# Patient Record
Sex: Male | Born: 1980 | Race: Asian | Hispanic: No | Marital: Married | Smoking: Never smoker
Health system: Southern US, Community
[De-identification: ages and names within clinical notes are randomized; demographics above are authoritative.]

---

## 2010-12-17 ENCOUNTER — Ambulatory Visit: Payer: Self-pay

## 2017-06-06 ENCOUNTER — Encounter: Payer: Self-pay | Admitting: Emergency Medicine

## 2017-06-06 ENCOUNTER — Other Ambulatory Visit: Payer: Self-pay

## 2017-06-06 ENCOUNTER — Emergency Department
Admission: EM | Admit: 2017-06-06 | Discharge: 2017-06-07 | Disposition: A | Discharge status: Home / Self Care | Payer: 59 | Attending: Emergency Medicine | Admitting: Emergency Medicine

## 2017-06-06 DIAGNOSIS — R197 Diarrhea, unspecified: Secondary | ICD-10-CM | POA: Insufficient documentation

## 2017-06-06 DIAGNOSIS — E860 Dehydration: Secondary | ICD-10-CM | POA: Insufficient documentation

## 2017-06-06 DIAGNOSIS — A0811 Acute gastroenteropathy due to Norwalk agent: Secondary | ICD-10-CM | POA: Diagnosis not present

## 2017-06-06 LAB — CBC
HCT: 41.2 % (ref 40.0–52.0)
Hemoglobin: 13.7 g/dL (ref 13.0–18.0)
MCH: 29.8 pg (ref 26.0–34.0)
MCHC: 33.3 g/dL (ref 32.0–36.0)
MCV: 89.7 fL (ref 80.0–100.0)
Platelets: 407 10*3/uL (ref 150–440)
RBC: 4.6 MIL/uL (ref 4.40–5.90)
RDW: 12.6 % (ref 11.5–14.5)
WBC: 4.4 10*3/uL (ref 3.8–10.6)

## 2017-06-06 LAB — COMPREHENSIVE METABOLIC PANEL
ALT: 23 U/L (ref 17–63)
AST: 29 U/L (ref 15–41)
Albumin: 4 g/dL (ref 3.5–5.0)
Alkaline Phosphatase: 66 U/L (ref 38–126)
Anion gap: 7 (ref 5–15)
BUN: 16 mg/dL (ref 6–20)
CO2: 27 mmol/L (ref 22–32)
Calcium: 9 mg/dL (ref 8.9–10.3)
Chloride: 105 mmol/L (ref 101–111)
Creatinine, Ser: 0.9 mg/dL (ref 0.61–1.24)
GFR calc Af Amer: >60 mL/min (ref >60)
GFR calc non Af Amer: >60 mL/min (ref >60)
Glucose, Bld: 106 mg/dL — ABNORMAL HIGH (ref 65–99)
Potassium: 4.3 mmol/L (ref 3.5–5.1)
Sodium: 139 mmol/L (ref 135–145)
Total Bilirubin: 0.6 mg/dL (ref 0.3–1.2)
Total Protein: 7.6 g/dL (ref 6.5–8.1)

## 2017-06-06 LAB — GASTROINTESTINAL PANEL BY PCR, STOOL (REPLACES STOOL CULTURE)

## 2017-06-06 LAB — URINALYSIS, COMPLETE (UACMP) WITH MICROSCOPIC
Bacteria, UA: NONE SEEN
Bilirubin Urine: NEGATIVE
Glucose, UA: NEGATIVE mg/dL
Hgb urine dipstick: NEGATIVE
Ketones, ur: NEGATIVE mg/dL
Leukocytes, UA: NEGATIVE
Nitrite: NEGATIVE
Protein, ur: NEGATIVE mg/dL
RBC / HPF: NONE SEEN RBC/hpf (ref 0–5)
Specific Gravity, Urine: 1.003 — ABNORMAL LOW (ref 1.005–1.030)
Squamous Epithelial / LPF: NONE SEEN
WBC, UA: NONE SEEN WBC/hpf (ref 0–5)
pH: 6 (ref 5.0–8.0)

## 2017-06-06 LAB — C DIFFICILE QUICK SCREEN W PCR REFLEX
C Diff antigen: NEGATIVE
C Diff interpretation: NOT DETECTED
C Diff toxin: NEGATIVE

## 2017-06-06 LAB — LIPASE, BLOOD: Lipase: 33 U/L (ref 11–51)

## 2017-06-06 MED ORDER — LOPERAMIDE HCL 2 MG PO CAPS
2.0000 mg | ORAL_CAPSULE | Freq: Once | ORAL | Status: AC
  Administered 2017-06-06: 2 mg via ORAL
  Filled 2017-06-06: qty 1

## 2017-06-06 MED ORDER — SODIUM CHLORIDE 0.9 % IV BOLUS (SEPSIS)
1000.0000 mL | Freq: Once | INTRAVENOUS | Status: AC
  Administered 2017-06-06: 1000 mL via INTRAVENOUS

## 2017-06-06 MED ORDER — ONDANSETRON 4 MG PO TBDP: 4.0000 mg | ORAL_TABLET | Freq: Four times a day (QID) | ORAL | 0 refills | Status: DC | PRN

## 2017-06-06 NOTE — ED Notes (Signed)
ED Provider at bedside. 

## 2017-06-06 NOTE — ED Triage Notes (Signed)
Pt to ED via POV. Pt was diagnosed with pneumonia on Thursday and started on antibiotics. Pt has had diarrhea since Wednesday night. Diarrhea had not gotten any better. Pt has had 6-7 episodes of diarrhea in the last 24 hours. Pt is in NAD in triage, VS WNL.

## 2017-06-06 NOTE — ED Notes (Signed)
Patient reports he was dx with pneumonia and placed on Azithromycin Thursday. Patient reports multiple liquid stools since beginning the antibiotic. Patient reports 6-7 liquid stools in the last 24 hours. Patient denies pain, N/V.

## 2017-06-06 NOTE — ED Provider Notes (Signed)
[facility] Emergency Department Provider Note  ____________________________________________   First MD Initiated Contact with Patient 06/06/17 2057     (approximate)  I have reviewed the triage vital signs and the nursing notes.   HISTORY  Chief Complaint Diarrhea   HPI Gabriel Williamson is a 37 y.o. male reports no previous significant medical history, other than being diagnosed with the flu and treated with Tamiflu 2 weeks ago, then started to develop cough congestion about 3 days ago, placed on azithromycin, and also began having diarrhea the day before being diagnosed with a possible mild pneumonia 3 days ago.  Patient reports his breathing is much better, he is not short of breath, not having a productive cough fevers or chills.  He does however report that he is been having a lot of loose stool, he has had watery stools with no black or bloody stool.  He has about 6-7 a day for the last 2 days.  He is been drinking fluids well and is not having pain, no nausea no vomiting, but self reports lots of loose stool.  He is not training antidiarrheals.  He does report a history of travel to [place] about 2 months ago, but was not sick at the time and not been sick since returning until he got influenza.  Reports overall that he feels well, except he is concerned about the frequent diarrhea that he is having.  He does feel slightly dehydrated, but denies lightheadedness.  Reports he is able to drink lots of fluids and has been able to hold him down, but he does not think he is absorbing much.  Continues to eat well and has good appetite.  History reviewed. No pertinent past medical history.  There are no active problems to display for this patient.   History reviewed. No pertinent surgical history.  Prior to Admission medications   Medication Sig Start Date End Date Taking? Authorizing Provider  ondansetron (ZOFRAN ODT) 4 MG disintegrating tablet Take 1 tablet (4  mg total) by mouth every 6 (six) hours as needed for nausea or vomiting. 06/06/17   Sharyn Creamer, MD  Azithromycin, completed 2 days of therapy Tamiflu 2 weeks ago  Allergies Patient has no known allergies.  No family history on file.  Social History Social History   Tobacco Use  . Smoking status: Never Smoker  . Smokeless tobacco: Never Used  Substance Use Topics  . Alcohol use: Not Currently  . Drug use: Not Currently    Review of Systems Constitutional: No fever/chills Eyes: No visual changes. ENT: No sore throat.  Feels slightly dry. Cardiovascular: Denies chest pain. Respiratory: Denies shortness of breath.  Had a productive cough a couple days ago, since starting azithromycin this is gone away.  Not short of breath.  Feels like his breathing is getting much better. Gastrointestinal: No abdominal pain.  No nausea, no vomiting.    No constipation. Genitourinary: Negative for dysuria. Musculoskeletal: Negative for back pain. Skin: Negative for rash. Neurological: Negative for headaches, focal weakness or numbness.    ____________________________________________   PHYSICAL EXAM:  VITAL SIGNS: ED Triage Vitals [06/06/17 1556]  Enc Vitals Group     BP 114/68     Pulse Rate 79     Resp 16     Temp 98.4 F (36.9 C)     Temp Source Oral     SpO2 100 %     Weight 115 lb (52.2 kg)     Height 5\' 3"  (  1.6 m)     Head Circumference      Peak Flow      Pain Score 0     Pain Loc      Pain Edu?      Excl. in GC?     Constitutional: Alert and oriented. Well appearing and in no acute distress. Eyes: Conjunctivae are normal. Head: Atraumatic. Nose: No congestion/rhinnorhea. Mouth/Throat: Mucous membranes are moist. Neck: No stridor.   Cardiovascular: Normal rate, regular rhythm. Grossly normal heart sounds.  Good peripheral circulation. Respiratory: Normal respiratory effort.  No retractions. Lungs CTAB. Gastrointestinal: Soft and nontender. No  distention. Musculoskeletal: No lower extremity tenderness nor edema. Neurologic:  Normal speech and language. No gross focal neurologic deficits are appreciated.  Skin:  Skin is warm, dry and intact. No rash noted. Psychiatric: Mood and affect are normal. Speech and behavior are normal.  ____________________________________________   LABS (all labs ordered are listed, but only abnormal results are displayed)  Labs Reviewed  GASTROINTESTINAL PANEL BY PCR, STOOL (REPLACES STOOL CULTURE) - Abnormal; Notable for the following components:      Result Value   Norovirus GI/GII DETECTED (*)    All other components within normal limits  COMPREHENSIVE METABOLIC PANEL - Abnormal; Notable for the following components:   Glucose, Bld 106 (*)    All other components within normal limits  URINALYSIS, COMPLETE (UACMP) WITH MICROSCOPIC - Abnormal; Notable for the following components:   Color, Urine COLORLESS (*)    APPearance CLEAR (*)    Specific Gravity, Urine 1.003 (*)    All other components within normal limits  C DIFFICILE QUICK SCREEN W PCR REFLEX  LIPASE, BLOOD  CBC   ____________________________________________  EKG   ____________________________________________  RADIOLOGY  No indication to noted for chest imaging.  No indication to note for abdominal imaging, denies pain, afebrile. ____________________________________________   PROCEDURES  Procedure(s) performed: None  Procedures  Critical Care performed: No  ____________________________________________   INITIAL IMPRESSION / ASSESSMENT AND PLAN / ED COURSE  Pertinent labs & imaging results that were available during my care of the patient were reviewed by me and considered in my medical decision making (see chart for details).  Patient presents for evaluation of loose stools and diarrhea.  Proximally 3 days of loose watery stools, up to 6-7 loose watery bowel movements a day.  Hemodynamically stable, in no  distress, labs reassuring without evidence of major electrolyte D disorder or dehydration.  White count normal.  Reassuring abdominal exam.  Urinalysis negative.  Suspect likely self-limiting viral illness in the setting of recent upper respiratory infection and flu 2 weeks ago, now being treated with azithromycin, but reports his diarrhea started prior to treatment with antibiotic.  He appears well.  Does have a history of travel to [place], thus I will send stool culture, though the patient denies developing any infectious symptoms during or shortly following his travel abroad.  Overall suspect likely self-limited disease.  No evidence of septicemia.      ----------------------------------------- 11:37 PM on 06/06/2017 -----------------------------------------  Panel positive for neurovirus, appears consistent with his infectious symptoms.  Self-limited viral illness.  Discussed treatment recommendations, provide Zofran, careful return precautions discussed with patient and family who are agreeable.  ____________________________________________   FINAL CLINICAL IMPRESSION(S) / ED DIAGNOSES  Final diagnoses:  Diarrhea of presumed infectious origin  Norovirus      NEW MEDICATIONS STARTED DURING THIS VISIT:  New Prescriptions   ONDANSETRON (ZOFRAN ODT) 4 MG DISINTEGRATING TABLET  Take 1 tablet (4 mg total) by mouth every 6 (six) hours as needed for nausea or vomiting.     Note:  This document was prepared using Dragon voice recognition software and may include unintentional dictation errors.     Sharyn CreamerQuale, Robin Pafford, MD 06/06/17 2337

## 2017-06-06 NOTE — Discharge Instructions (Addendum)
Please return to the emergency room right away if you are to develop a fever, severe nausea, pain, you are unable to keep food down, begin vomiting any dark or bloody fluid, you develop any dark or bloody stools, feel dehydrated, or other new concerns or symptoms arise.

## 2017-06-07 NOTE — ED Notes (Signed)
Reviewed discharge instructions, follow-up care, and prescriptions with patient. Patient verbalized understanding of all information reviewed. Patient stable, with no distress noted at this time.    

## 2017-06-17 ENCOUNTER — Encounter: Payer: Self-pay | Admitting: Internal Medicine

## 2017-06-17 ENCOUNTER — Ambulatory Visit: Payer: 59 | Admitting: Internal Medicine

## 2017-06-17 VITALS — BP 120/68 | HR 86 | Resp 16 | Ht 62.0 in | Wt 117.8 lb

## 2017-06-17 DIAGNOSIS — J189 Pneumonia, unspecified organism: Secondary | ICD-10-CM

## 2017-06-17 DIAGNOSIS — R059 Cough, unspecified: Secondary | ICD-10-CM

## 2017-06-17 DIAGNOSIS — R05 Cough: Secondary | ICD-10-CM

## 2017-06-17 DIAGNOSIS — Z09 Encounter for follow-up examination after completed treatment for conditions other than malignant neoplasm: Secondary | ICD-10-CM

## 2017-06-17 NOTE — Progress Notes (Signed)
[facility] [address] [place], [place] [address]  Internal MEDICINE  Office Visit Note  Patient Name: Gabriel Williamson  [id number]  [id number]  Date of Service: 07/10/2017  Chief Complaint  Patient presents with  . Follow-up    Patient was seen by urgent care and was diagnoses with pnuemonia was told to follow up with pcp    HPI  Pt is here for routine follow up. He is feeling better, will need a follow up cxr. Feels to his baseline but occasional cough   Current Medication: Outpatient Encounter Medications as of 06/17/2017  Medication Sig  . [DISCONTINUED] ondansetron (ZOFRAN ODT) 4 MG disintegrating tablet Take 1 tablet (4 mg total) by mouth every 6 (six) hours as needed for nausea or vomiting. (Patient not taking: Reported on 06/17/2017)   No facility-administered encounter medications on file as of 06/17/2017.     Surgical History: No past surgical history on file.  Medical History: No past medical history on file.  Family History: Family History  Problem Relation Age of Onset  . Diabetes Father     Social History   Socioeconomic History  . Marital status: Married    Spouse name: Not on file  . Number of children: Not on file  . Years of education: Not on file  . Highest education level: Not on file  Occupational History  . Not on file  Social Needs  . Financial resource strain: Not on file  . Food insecurity:    Worry: Not on file    Inability: Not on file  . Transportation needs:    Medical: Not on file    Non-medical: Not on file  Tobacco Use  . Smoking status: Never Smoker  . Smokeless tobacco: Never Used  Substance and Sexual Activity  . Alcohol use: Not Currently  . Drug use: Not Currently  . Sexual activity: Not on file  Lifestyle  . Physical activity:    Days per week: Not on file    Minutes per session: Not on file  . Stress: Not on file  Relationships  . Social connections:    Talks on phone: Not on file    Gets together: Not  on file    Attends religious service: Not on file    Active member of club or organization: Not on file    Attends meetings of clubs or organizations: Not on file    Relationship status: Not on file  . Intimate partner violence:    Fear of current or ex partner: Not on file    Emotionally abused: Not on file    Physically abused: Not on file    Forced sexual activity: Not on file  Other Topics Concern  . Not on file  Social History Narrative  . Not on file    Review of Systems  Constitutional: Negative for chills, fatigue and unexpected weight change.  HENT: Positive for postnasal drip. Negative for congestion, rhinorrhea, sneezing and sore throat.   Eyes: Negative for redness.  Respiratory: Negative for cough, chest tightness and shortness of breath.   Cardiovascular: Negative for chest pain and palpitations.  Gastrointestinal: Negative for abdominal pain, constipation, diarrhea, nausea and vomiting.  Genitourinary: Negative for dysuria and frequency.  Musculoskeletal: Negative for arthralgias, back pain, joint swelling and neck pain.  Skin: Negative for rash.  Neurological: Negative.  Negative for tremors and numbness.  Hematological: Negative for adenopathy. Does not bruise/bleed easily.  Psychiatric/Behavioral: Negative for behavioral problems (Depression), sleep disturbance and  suicidal ideas. The patient is not nervous/anxious.    Vital Signs: BP 120/68   Pulse 86   Resp 16   Ht 5\' 2"  (1.575 m)   Wt 117 lb 12.8 oz (53.4 kg)   SpO2 98%   BMI 21.55 kg/m    Physical Exam  Constitutional: He is oriented to person, place, and time. He appears well-developed and well-nourished.  HENT:  Head: Normocephalic and atraumatic.  Mouth/Throat: Oropharynx is clear and moist.  Eyes: Pupils are equal, round, and reactive to light. EOM are normal.  Neck: Normal range of motion. Neck supple.  Cardiovascular: Normal rate, regular rhythm and normal heart sounds. Exam reveals no  gallop and no friction rub.  No murmur heard. Pulmonary/Chest: Effort normal.  Abdominal: Soft. Bowel sounds are normal.  Musculoskeletal: Normal range of motion.  Neurological: He is alert and oriented to person, place, and time.  Skin: Skin is warm and dry. He is not diaphoretic.  Psychiatric: He has a normal mood and affect.   Assessment/Plan: 1. Pneumonia due to infectious organism, unspecified laterality, unspecified part of lung - Finished abx as prescribed by urgent care  2. Follow-up exam - CBC with Differential/Platelet - Lipid Panel With LDL/HDL Ratio - TSH - T4, free - Comprehensive metabolic panel - Urinalysis - [id number] and Folate Panel - Fe+TIBC+Fer  3. Cough - DG Chest 2 View; Future  General Counseling: [name] verbalizes understanding of the findings of todays visit and agrees with plan of treatment. I have discussed any further diagnostic evaluation that may be needed or ordered today. We also reviewed his medications today. he has been encouraged to call the office with any questions or concerns that should arise related to todays visit  Orders Placed This Encounter  Procedures  . DG Chest 2 View  . CBC with Differential/Platelet  . Lipid Panel With LDL/HDL Ratio  . TSH  . T4, free  . Comprehensive metabolic panel  . Urinalysis  . B12 and Folate Panel  . Fe+TIBC+Fer   Time spent:15 Minutes   Dr Lyndon CodeFozia M Anuoluwapo Mefferd Internal medicine

## 2017-06-18 ENCOUNTER — Other Ambulatory Visit: Payer: Self-pay | Admitting: Internal Medicine

## 2017-06-18 DIAGNOSIS — R7309 Other abnormal glucose: Secondary | ICD-10-CM

## 2018-01-05 ENCOUNTER — Encounter: Payer: Self-pay | Admitting: Nurse Practitioner

## 2019-06-06 ENCOUNTER — Ambulatory Visit: Payer: 59 | Attending: Internal Medicine

## 2019-06-06 DIAGNOSIS — Z23 Encounter for immunization: Secondary | ICD-10-CM

## 2019-06-06 NOTE — Progress Notes (Signed)
   Covid-19 Vaccination Clinic  Name:  Gabriel Williamson    MRN: [id number] DOB: December 18, 1980  06/06/2019  [name] was observed post Covid-19 immunization for 15 minutes without incident. He was provided with Vaccine Information Sheet and instruction to access the V-Safe system.   [name] was instructed to call 911 with any severe reactions post vaccine: Marland Kitchen Difficulty breathing  . Swelling of face and throat  . A fast heartbeat  . A bad rash all over body  . Dizziness and weakness   Immunizations Administered    Name Date Dose VIS Date Route   Pfizer COVID-19 Vaccine 06/06/2019 12:15 PM 0.3 mL 02/25/2019 Intramuscular   Manufacturer: [employer], [employer]   Lot: [id number]   NDC: [address]-4

## 2019-06-28 ENCOUNTER — Ambulatory Visit: Payer: 59

## 2019-06-29 ENCOUNTER — Ambulatory Visit: Payer: 59 | Attending: Internal Medicine

## 2019-06-29 DIAGNOSIS — Z23 Encounter for immunization: Secondary | ICD-10-CM

## 2019-06-29 NOTE — Progress Notes (Signed)
   Covid-19 Vaccination Clinic  Name:  Gabriel Williamson    MRN: [id number] DOB: [birth year]  06/29/2019  [name] was observed post Covid-19 immunization for 15 minutes without incident. He was provided with Vaccine Information Sheet and instruction to access the V-Safe system.   [name] was instructed to call 911 with any severe reactions post vaccine: Marland Kitchen Difficulty breathing  . Swelling of face and throat  . A fast heartbeat  . A bad rash all over body  . Dizziness and weakness   Immunizations Administered    Name Date Dose VIS Date Route   Pfizer COVID-19 Vaccine 06/29/2019  1:13 PM 0.3 mL 02/25/2019 Intramuscular   Manufacturer: [employer], [employer]   Lot: [id number]   NDC: [address]-8

## 2019-09-29 ENCOUNTER — Ambulatory Visit (LOCAL_COMMUNITY_HEALTH_CENTER): Payer: 59

## 2019-09-29 ENCOUNTER — Other Ambulatory Visit: Payer: Self-pay

## 2019-09-29 VITALS — Wt 120.0 lb

## 2019-09-29 DIAGNOSIS — Z201 Contact with and (suspected) exposure to tuberculosis: Secondary | ICD-10-CM

## 2019-09-30 NOTE — Progress Notes (Signed)
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 Discussed with patient LTBI vs Active TB tx if QFT is positive. Richmond Campbell, RN

## 2019-10-03 ENCOUNTER — Telehealth: Payer: Self-pay

## 2019-10-03 LAB — QUANTIFERON-TB GOLD PLUS
QuantiFERON Mitogen Value: >10 IU/mL
QuantiFERON Nil Value: 0 IU/mL
QuantiFERON TB1 Ag Value: 0.04 IU/mL
QuantiFERON TB2 Ag Value: 0.05 IU/mL
QuantiFERON-TB Gold Plus: NEGATIVE

## 2019-10-03 NOTE — Telephone Encounter (Signed)
TC with patient.  Informed will repeat QFT in 8 weeks d/t household contact still contagious. Richmond Campbell, RN

## 2020-01-04 ENCOUNTER — Ambulatory Visit (LOCAL_COMMUNITY_HEALTH_CENTER): Payer: 59

## 2020-01-04 DIAGNOSIS — Z201 Contact with and (suspected) exposure to tuberculosis: Secondary | ICD-10-CM

## 2020-01-04 NOTE — Progress Notes (Signed)
QFT- 8 week follow up- contact to TB Richmond Campbell, RN

## 2020-01-07 LAB — QUANTIFERON-TB GOLD PLUS
QuantiFERON Mitogen Value: >10 IU/mL
QuantiFERON Nil Value: 2.18 IU/mL
QuantiFERON TB1 Ag Value: 2.07 IU/mL
QuantiFERON TB2 Ag Value: 2.08 IU/mL
QuantiFERON-TB Gold Plus: NEGATIVE

## 2020-03-03 ENCOUNTER — Emergency Department
Admission: EM | Admit: 2020-03-03 | Discharge: 2020-03-03 | Disposition: A | Discharge status: Home / Self Care | Payer: 59 | Attending: Emergency Medicine | Admitting: Emergency Medicine

## 2020-03-03 ENCOUNTER — Emergency Department: Payer: 59

## 2020-03-03 ENCOUNTER — Encounter: Payer: Self-pay | Admitting: Emergency Medicine

## 2020-03-03 ENCOUNTER — Other Ambulatory Visit: Payer: Self-pay

## 2020-03-03 DIAGNOSIS — R109 Unspecified abdominal pain: Secondary | ICD-10-CM | POA: Diagnosis present

## 2020-03-03 DIAGNOSIS — N200 Calculus of kidney: Secondary | ICD-10-CM

## 2020-03-03 LAB — BASIC METABOLIC PANEL
Anion gap: 8 (ref 5–15)
BUN: 17 mg/dL (ref 6–20)
CO2: 26 mmol/L (ref 22–32)
Calcium: 8.7 mg/dL — ABNORMAL LOW (ref 8.9–10.3)
Chloride: 102 mmol/L (ref 98–111)
Creatinine, Ser: 1.23 mg/dL (ref 0.61–1.24)
GFR, Estimated: >60 mL/min (ref >60)
Glucose, Bld: 157 mg/dL — ABNORMAL HIGH (ref 70–99)
Potassium: 4.1 mmol/L (ref 3.5–5.1)
Sodium: 136 mmol/L (ref 135–145)

## 2020-03-03 LAB — URINALYSIS, COMPLETE (UACMP) WITH MICROSCOPIC
Bacteria, UA: NONE SEEN
Bilirubin Urine: NEGATIVE
Glucose, UA: NEGATIVE mg/dL
Hgb urine dipstick: NEGATIVE
Ketones, ur: NEGATIVE mg/dL
Leukocytes,Ua: NEGATIVE
Nitrite: NEGATIVE
Protein, ur: NEGATIVE mg/dL
Specific Gravity, Urine: 1.025 (ref 1.005–1.030)
Squamous Epithelial / LPF: NONE SEEN (ref 0–5)
pH: 6 (ref 5.0–8.0)

## 2020-03-03 LAB — CBC
HCT: 41.6 % (ref 39.0–52.0)
Hemoglobin: 13.8 g/dL (ref 13.0–17.0)
MCH: 30.9 pg (ref 26.0–34.0)
MCHC: 33.2 g/dL (ref 30.0–36.0)
MCV: 93.1 fL (ref 80.0–100.0)
Platelets: 266 10*3/uL (ref 150–400)
RBC: 4.47 MIL/uL (ref 4.22–5.81)
RDW: 11.8 % (ref 11.5–15.5)
WBC: 10.2 10*3/uL (ref 4.0–10.5)
nRBC: 0 % (ref 0.0–0.2)

## 2020-03-03 MED ORDER — HYDROMORPHONE HCL 1 MG/ML IJ SOLN
0.5000 mg | Freq: Once | INTRAMUSCULAR | Status: AC
  Administered 2020-03-03: 0.5 mg via INTRAVENOUS
  Filled 2020-03-03: qty 1

## 2020-03-03 MED ORDER — SODIUM CHLORIDE 0.9 % IV BOLUS
1000.0000 mL | Freq: Once | INTRAVENOUS | Status: AC
  Administered 2020-03-03: 1000 mL via INTRAVENOUS

## 2020-03-03 MED ORDER — ONDANSETRON HCL 4 MG/2ML IJ SOLN
4.0000 mg | Freq: Once | INTRAMUSCULAR | Status: AC
  Administered 2020-03-03: 4 mg via INTRAVENOUS
  Filled 2020-03-03: qty 2

## 2020-03-03 MED ORDER — TAMSULOSIN HCL 0.4 MG PO CAPS: 0.4000 mg | ORAL_CAPSULE | Freq: Every day | ORAL | 0 refills | Status: AC

## 2020-03-03 MED ORDER — KETOROLAC TROMETHAMINE 30 MG/ML IJ SOLN
30.0000 mg | Freq: Once | INTRAMUSCULAR | Status: AC
  Administered 2020-03-03: 30 mg via INTRAVENOUS
  Filled 2020-03-03: qty 1

## 2020-03-03 MED ORDER — IBUPROFEN 600 MG PO TABS: 600.0000 mg | ORAL_TABLET | Freq: Three times a day (TID) | ORAL | 0 refills | Status: AC | PRN

## 2020-03-03 MED ORDER — MORPHINE SULFATE (PF) 4 MG/ML IV SOLN
4.0000 mg | Freq: Once | INTRAVENOUS | Status: AC
  Administered 2020-03-03: 4 mg via INTRAVENOUS
  Filled 2020-03-03: qty 1

## 2020-03-03 MED ORDER — ONDANSETRON 4 MG PO TBDP: 4.0000 mg | ORAL_TABLET | Freq: Three times a day (TID) | ORAL | 0 refills | Status: DC | PRN

## 2020-03-03 MED ORDER — HYDROCODONE-ACETAMINOPHEN 5-325 MG PO TABS: 1.0000 | ORAL_TABLET | Freq: Four times a day (QID) | ORAL | 0 refills | Status: DC | PRN

## 2020-03-03 MED ORDER — OXYCODONE-ACETAMINOPHEN 5-325 MG PO TABS
1.0000 | ORAL_TABLET | ORAL | Status: DC | PRN
  Administered 2020-03-03: 1 via ORAL
  Filled 2020-03-03: qty 1

## 2020-03-03 NOTE — ED Notes (Signed)
D/C and new RX discussed with pt, pt and wife verbalized understanding. VSS.

## 2020-03-03 NOTE — ED Triage Notes (Signed)
Unable to reassess VS at this time due to pt being in CT

## 2020-03-03 NOTE — ED Notes (Signed)
Pt tolerated PO challenge well, no nausea noted.

## 2020-03-03 NOTE — ED Notes (Signed)
Pt presents to ED with c/o of R flank pain that started last night. Pt deneis HX of kidney stones. Pt states N/V due to pain. Pt states 4 episodes of vomitus. NAD noted at this time. Pt denies fevers or chills. Pt is A&Ox4. Pt denies difficulty with urinating and states urinating as usual.Wife at bedside.

## 2020-03-03 NOTE — ED Provider Notes (Signed)
[facility] Emergency Department Provider Note  ____________________________________________   Event Date/Time   First MD Initiated Contact with Patient 03/03/20 [phone]     (approximate)  I have reviewed the triage vital signs and the nursing notes.   HISTORY  Chief Complaint Flank Pain    HPI Horris Speros is a 39 y.o. male  Here with R flank pain. Pt reports that around 11 PM last night, he awoke with severe, aching, gnawing R flank pain. Since then, the pain has persisted btu intermittently worsened. He's had associated nausea, vomiting. No diarrhea, constipation. No fever, chills. He has not noticed any dysuria or frequency. No personal history of kidney stones but does have a family history. Pain mildly improved w/ some position changes, but then returns. No other alleviating factors. No specific aggravating factors.        History reviewed. No pertinent past medical history.  There are no problems to display for this patient.   History reviewed. No pertinent surgical history.  Prior to Admission medications   Medication Sig Start Date End Date Taking? Authorizing Provider  HYDROcodone-acetaminophen (NORCO/VICODIN) 5-325 MG tablet Take 1-2 tablets by mouth every 6 (six) hours as needed for moderate pain or severe pain (No more than 6 tabs per day). 03/03/20 03/03/21  Shaune Pollack, MD  ibuprofen (ADVIL) 600 MG tablet Take 1 tablet (600 mg total) by mouth every 8 (eight) hours as needed for moderate pain. 03/03/20   Shaune Pollack, MD  ondansetron (ZOFRAN ODT) 4 MG disintegrating tablet Take 1 tablet (4 mg total) by mouth every 8 (eight) hours as needed for nausea or vomiting. 03/03/20   Shaune Pollack, MD  tamsulosin (FLOMAX) 0.4 MG CAPS capsule Take 1 capsule (0.4 mg total) by mouth daily for 3 days. 03/03/20 03/06/20  Shaune Pollack, MD    Allergies Patient has no known allergies.  Family History  Problem Relation Age of Onset  . Diabetes  Father     Social History Social History   Tobacco Use  . Smoking status: Never Smoker  . Smokeless tobacco: Never Used  Vaping Use  . Vaping Use: Never used  Substance Use Topics  . Alcohol use: Not Currently  . Drug use: Not Currently    Review of Systems  Review of Systems  Constitutional: Negative for chills and fever.  HENT: Negative for sore throat.   Respiratory: Negative for shortness of breath.   Cardiovascular: Negative for chest pain.  Gastrointestinal: Positive for nausea and vomiting. Negative for abdominal pain.  Genitourinary: Positive for flank pain.  Musculoskeletal: Negative for neck pain.  Skin: Negative for rash and wound.  Allergic/Immunologic: Negative for immunocompromised state.  Neurological: Negative for weakness and numbness.  Hematological: Does not bruise/bleed easily.  All other systems reviewed and are negative.    ____________________________________________  PHYSICAL EXAM:      VITAL SIGNS: ED Triage Vitals  Enc Vitals Group     BP 03/03/20 0224 114/81     Pulse Rate 03/03/20 0224 95     Resp 03/03/20 0224 20     Temp 03/03/20 0224 98.6 F (37 C)     Temp Source 03/03/20 0224 Oral     SpO2 03/03/20 0224 100 %     Weight 03/03/20 0227 120 lb (54.4 kg)     Height 03/03/20 0227 5\' 3"  (1.6 m)     Head Circumference --      Peak Flow --      Pain Score 03/03/20 0226  10     Pain Loc --      Pain Edu? --      Excl. in GC? --      Physical Exam Vitals and nursing note reviewed.  Constitutional:      General: He is not in acute distress.    Appearance: He is well-developed.  HENT:     Head: Normocephalic and atraumatic.  Eyes:     Conjunctiva/sclera: Conjunctivae normal.  Cardiovascular:     Rate and Rhythm: Normal rate and regular rhythm.     Heart sounds: Normal heart sounds. No murmur heard. No friction rub.  Pulmonary:     Effort: Pulmonary effort is normal. No respiratory distress.     Breath sounds: Normal breath  sounds. No wheezing or rales.  Abdominal:     General: There is no distension.     Palpations: Abdomen is soft.     Tenderness: There is abdominal tenderness in the right upper quadrant, right lower quadrant and suprapubic area. There is no guarding or rebound.  Musculoskeletal:     Cervical back: Neck supple.  Skin:    General: Skin is warm.     Capillary Refill: Capillary refill takes less than 2 seconds.  Neurological:     Mental Status: He is alert and oriented to person, place, and time.     Motor: No abnormal muscle tone.       ____________________________________________   LABS (all labs ordered are listed, but only abnormal results are displayed)  Labs Reviewed  URINALYSIS, COMPLETE (UACMP) WITH MICROSCOPIC - Abnormal; Notable for the following components:      Result Value   Color, Urine YELLOW (*)    APPearance HAZY (*)    All other components within normal limits  BASIC METABOLIC PANEL - Abnormal; Notable for the following components:   Glucose, Bld 157 (*)    Calcium 8.7 (*)    All other components within normal limits  CBC    ____________________________________________  EKG:  ________________________________________  RADIOLOGY All imaging, including plain films, CT scans, and ultrasounds, independently reviewed by me, and interpretations confirmed via formal radiology reads.  ED MD interpretation:   CT Stone: 2 mm stone distal right ureter with mild hydro  Official radiology report(s): CT Renal Stone Study  Result Date: 03/03/2020 CLINICAL DATA:  Right flank pain. EXAM: CT ABDOMEN AND PELVIS WITHOUT CONTRAST TECHNIQUE: Multidetector CT imaging of the abdomen and pelvis was performed following the standard protocol without IV contrast. COMPARISON:  None. FINDINGS: Lower chest: No acute abnormality. Hepatobiliary: No focal liver abnormality is seen. No gallstones, gallbladder wall thickening, or biliary dilatation. Pancreas: Unremarkable. No pancreatic  ductal dilatation or surrounding inflammatory changes. Spleen: Normal in size without focal abnormality. Adrenals/Urinary Tract: Adrenal glands are normal. No renal stones identified. There is mild right hydronephrosis and perinephric stranding. There is right ureterectasis. These finding are due to a 2 mm stone in the distal right ureter. The left kidney is smaller than the right with no hydronephrosis or perinephric stranding. The left ureter and bladder are normal. Stomach/Bowel: The stomach and small bowel are normal. The appendix is normal in appearance. The colon is unremarkable. Vascular/Lymphatic: No significant vascular findings are present. No enlarged abdominal or pelvic lymph nodes. Reproductive: Prostate is unremarkable. Other: No free air or free fluid. There is a fat containing periumbilical hernia which is small. There is a rounded partially calcified structure along the lateral aspect of the cecum on axial image 51 of doubtful significance.  Musculoskeletal: No acute or significant osseous findings. IMPRESSION: 1. There is a 2 mm stone in the distal right ureter resulting in mild hydronephrosis, perinephric stranding, and ureterectasis. 2. The left kidney measures 8.4 cm in craniocaudal dimension, mildly atrophic. 3. No other acute abnormalities. Electronically Signed   By: Gerome Sam III M.D   On: 03/03/2020 08:58    ____________________________________________  PROCEDURES   Procedure(s) performed (including Critical Care):  Procedures  ____________________________________________  INITIAL IMPRESSION / MDM / ASSESSMENT AND PLAN / ED COURSE  As part of my medical decision making, I reviewed the following data within the electronic MEDICAL RECORD NUMBER Nursing notes reviewed and incorporated, Old chart reviewed, Notes from prior ED visits, and [place] Controlled Substance Database       *Aarib Pulido was evaluated in Emergency Department on 03/03/2020 for the symptoms described in the  history of present illness. He was evaluated in the context of the global COVID-19 pandemic, which necessitated consideration that the patient might be at risk for infection with the SARS-CoV-2 virus that causes COVID-19. Institutional protocols and algorithms that pertain to the evaluation of patients at risk for COVID-19 are in a state of rapid change based on information released by regulatory bodies including the CDC and federal and state organizations. These policies and algorithms were followed during the patient's care in the ED.  Some ED evaluations and interventions may be delayed as a result of limited staffing during the pandemic.*     Medical Decision Making:  39 yo M here with R flank pain. CT reviewed, shows 2 mm stone distal R ureter which corresponds with his symptoms. UA has no pyuria, bacteriuria or signs of infection. WBC normal. No testicular/scrotal pain. Pt given fluids, antiemetics and analgesics in ED with good effect. Will d/c with encouraged hydration, supportive care, outpt follow-up.  ____________________________________________  FINAL CLINICAL IMPRESSION(S) / ED DIAGNOSES  Final diagnoses:  Kidney stone  Flank pain     MEDICATIONS GIVEN DURING THIS VISIT:  Medications  sodium chloride 0.9 % bolus 1,000 mL (1,000 mLs Intravenous Bolus 03/03/20 0901)  morphine 4 MG/ML injection 4 mg (4 mg Intravenous Given 03/03/20 0902)  ondansetron (ZOFRAN) injection 4 mg (4 mg Intravenous Given 03/03/20 0902)  ketorolac (TORADOL) 30 MG/ML injection 30 mg (30 mg Intravenous Given 03/03/20 0903)  HYDROmorphone (DILAUDID) injection 0.5 mg (0.5 mg Intravenous Given 03/03/20 1056)     ED Discharge Orders         Ordered    ibuprofen (ADVIL) 600 MG tablet  Every 8 hours PRN        03/03/20 0908    HYDROcodone-acetaminophen (NORCO/VICODIN) 5-325 MG tablet  Every 6 hours PRN        03/03/20 0908    ondansetron (ZOFRAN ODT) 4 MG disintegrating tablet  Every 8 hours PRN         03/03/20 0908    tamsulosin (FLOMAX) 0.4 MG CAPS capsule  Daily        03/03/20 0908           Note:  This document was prepared using Dragon voice recognition software and may include unintentional dictation errors.   Shaune Pollack, MD 03/03/20 1125

## 2020-03-03 NOTE — ED Triage Notes (Signed)
Pt reports right flank pain since 10 pm last night reports has vomited about 4 times, denies any urinary symptoms. Pt talks in complete sentences no respiratory distress noted

## 2020-03-03 NOTE — Discharge Instructions (Addendum)
Your symptoms today were due to a kidney stone.  Drink plenty of fluids.  I prescribed pain medication, nausea medication, as well as Flomax, which will hopefully help you pass the stone.

## 2020-03-12 ENCOUNTER — Other Ambulatory Visit: Payer: Self-pay

## 2020-03-12 ENCOUNTER — Encounter: Payer: Self-pay | Admitting: Urology

## 2020-03-12 ENCOUNTER — Ambulatory Visit
Admission: RE | Admit: 2020-03-12 | Discharge: 2020-03-12 | Disposition: A | Discharge status: Home / Self Care | Payer: 59 | Attending: Urology | Admitting: Urology

## 2020-03-12 ENCOUNTER — Ambulatory Visit: Payer: 59 | Admitting: Urology

## 2020-03-12 ENCOUNTER — Ambulatory Visit
Admission: RE | Admit: 2020-03-12 | Discharge: 2020-03-12 | Disposition: A | Discharge status: Home / Self Care | Payer: 59 | Source: Ambulatory Visit | Attending: Urology | Admitting: Urology

## 2020-03-12 VITALS — BP 114/75 | HR 92 | Ht 63.0 in | Wt 117.0 lb

## 2020-03-12 DIAGNOSIS — N200 Calculus of kidney: Secondary | ICD-10-CM

## 2020-03-12 DIAGNOSIS — N201 Calculus of ureter: Secondary | ICD-10-CM

## 2020-03-12 NOTE — Progress Notes (Signed)
03/12/2020 9:23 AM   Gabriel Williamson November 02, 1980 [id number]  Referring provider: [facility] [address] [place],  [place] [address]  Chief Complaint  Patient presents with  . Nephrolithiasis    HPI: Gabriel Williamson is a 39 y.o. male seen in follow-up for recent ED visit for renal colic.   Presented to [facility] ED 03/03/2020 with several hour history of right flank pain; some radiation to the right lower quadrant  + Nausea/vomiting  No fever or chills  Stone protocol CT remarkable for a 2 mm right distal ureteral calculus with mild hydronephrosis/hydroureter  Discharged on hydrocodone, ibuprofen, Zofran and tamsulosin  States he continues to have intermittent right flank pain and is taking ibuprofen  Remains on tamsulosin   PMH: History reviewed. No pertinent past medical history.  Surgical History: History reviewed. No pertinent surgical history.  Home Medications:  Allergies as of 03/12/2020   No Known Allergies     Medication List       Accurate as of March 12, 2020  9:23 AM. If you have any questions, ask your nurse or doctor.        HYDROcodone-acetaminophen 5-325 MG tablet Commonly known as: NORCO/VICODIN Take 1-2 tablets by mouth every 6 (six) hours as needed for moderate pain or severe pain (No more than 6 tabs per day).   ibuprofen 600 MG tablet Commonly known as: ADVIL Take 1 tablet (600 mg total) by mouth every 8 (eight) hours as needed for moderate pain.   ondansetron 4 MG disintegrating tablet Commonly known as: Zofran ODT Take 1 tablet (4 mg total) by mouth every 8 (eight) hours as needed for nausea or vomiting.       Allergies: No Known Allergies  Family History: Family History  Problem Relation Age of Onset  . Diabetes Father     Social History:  reports that he has never smoked. He has never used smokeless tobacco. He reports previous alcohol use. He reports previous drug use.   Physical Exam: BP 114/75   Pulse  92   Ht 5\' 3"  (1.6 m)   Wt 117 lb (53.1 kg)   BMI 20.73 kg/m   Constitutional:  Alert and oriented, No acute distress. HEENT: [place] AT, moist mucus membranes.  Trachea midline, no masses. Cardiovascular: No clubbing, cyanosis, or edema. Respiratory: Normal respiratory effort, no increased work of breathing. GI: Abdomen is soft, nontender, nondistended, no abdominal masses GU: No CVA tenderness Lymph: No cervical or inguinal lymphadenopathy. Skin: No rashes, bruises or suspicious lesions. Neurologic: Grossly intact, no focal deficits, moving all 4 extremities. Psychiatric: Normal mood and affect.  Laboratory Data:  Urinalysis Dipstick/microscopy negative  Pertinent Imaging: Images were personally reviewed and interpreted  CT Renal Stone Study  Narrative CLINICAL DATA:  Right flank pain.  EXAM: CT ABDOMEN AND PELVIS WITHOUT CONTRAST  TECHNIQUE: Multidetector CT imaging of the abdomen and pelvis was performed following the standard protocol without IV contrast.  COMPARISON:  None.  FINDINGS: Lower chest: No acute abnormality.  Hepatobiliary: No focal liver abnormality is seen. No gallstones, gallbladder wall thickening, or biliary dilatation.  Pancreas: Unremarkable. No pancreatic ductal dilatation or surrounding inflammatory changes.  Spleen: Normal in size without focal abnormality.  Adrenals/Urinary Tract: Adrenal glands are normal. No renal stones identified. There is mild right hydronephrosis and perinephric stranding. There is right ureterectasis. These finding are due to a 2 mm stone in the distal right ureter. The left kidney is smaller than the right with no hydronephrosis or perinephric stranding.  The left ureter and bladder are normal.  Stomach/Bowel: The stomach and small bowel are normal. The appendix is normal in appearance. The colon is unremarkable.  Vascular/Lymphatic: No significant vascular findings are present. No enlarged abdominal or pelvic  lymph nodes.  Reproductive: Prostate is unremarkable.  Other: No free air or free fluid. There is a fat containing periumbilical hernia which is small. There is a rounded partially calcified structure along the lateral aspect of the cecum on axial image 51 of doubtful significance.  Musculoskeletal: No acute or significant osseous findings.  IMPRESSION: 1. There is a 2 mm stone in the distal right ureter resulting in mild hydronephrosis, perinephric stranding, and ureterectasis. 2. The left kidney measures 8.4 cm in craniocaudal dimension, mildly atrophic. 3. No other acute abnormalities.   Electronically Signed By: Gerome Sam III M.D On: 03/03/2020 08:58   Assessment & Plan:    1.  Right distal ureteral calculus We discussed various treatment options for urolithiasis including observation with or without medical expulsive therapy, shockwave lithotripsy (SWL), ureteroscopy and laser lithotripsy with stent placement  We discussed that management is based on stone size, location, density, patient co-morbidities, and patient preference.   Stones <72mm in size have a >80% spontaneous passage rate. Data surrounding the use of tamsulosin for medical expulsive therapy is controversial, but meta analyses suggests it is most efficacious for distal stones between 5-44mm in size. Possible side effects include dizziness/lightheadedness, and retrograde ejaculation.  SWL has a lower stone free rate in a single procedure, but also a lower complication rate compared to ureteroscopy and avoids a stent and associated stent related symptoms. Possible complications include renal hematoma, steinstrasse, and need for additional treatment.  Ureteroscopy with laser lithotripsy and stent placement has a higher stone free rate than SWL in a single procedure, however increased complication rate including possible infection, ureteral injury, bleeding, and stent related morbidity. Common stent related  symptoms include dysuria, urgency/frequency, and flank pain.  Based on stone size and location have recommended continued medical expulsive therapy.  He will call for worsening symptoms.  KUB ordered today to see if the calculus is visualized on KUB.   Follow-up 2 weeks with repeat KUB and earlier for worsening symptoms or desire for ureteroscopy/ESWL    Riki Altes, MD  [facility] [address] [place], [place] [address] [phone]

## 2020-03-13 ENCOUNTER — Telehealth: Payer: Self-pay | Admitting: *Deleted

## 2020-03-13 ENCOUNTER — Encounter: Payer: Self-pay | Admitting: Urology

## 2020-03-13 LAB — MICROSCOPIC EXAMINATION
Bacteria, UA: NONE SEEN
Epithelial Cells (non renal): NONE SEEN /hpf (ref 0–10)

## 2020-03-13 LAB — URINALYSIS, COMPLETE
Bilirubin, UA: NEGATIVE
Glucose, UA: NEGATIVE
Ketones, UA: NEGATIVE
Leukocytes,UA: NEGATIVE
Nitrite, UA: NEGATIVE
Protein,UA: NEGATIVE
RBC, UA: NEGATIVE
Specific Gravity, UA: 1.02 (ref 1.005–1.030)
Urobilinogen, Ur: 0.2 mg/dL (ref 0.2–1.0)
pH, UA: 7 (ref 5.0–7.5)

## 2020-03-13 NOTE — Telephone Encounter (Signed)
Patient advised, voiced understanding.

## 2020-03-13 NOTE — Telephone Encounter (Signed)
-----   Message from Riki Altes, MD sent at 03/12/2020  5:24 PM EST ----- There is a calcification in the right pelvis seen on KUB which most likely represents his stone.  Recommend a follow-up office visit with KUB in 2 weeks.  If he has worsening pain and desires earlier treatment please have him contact the office for an earlier appointment

## 2020-03-26 ENCOUNTER — Ambulatory Visit: Payer: Self-pay | Admitting: Urology

## 2020-03-26 ENCOUNTER — Other Ambulatory Visit: Payer: Self-pay | Admitting: Family Medicine

## 2020-03-26 DIAGNOSIS — N201 Calculus of ureter: Secondary | ICD-10-CM

## 2020-03-28 ENCOUNTER — Other Ambulatory Visit: Payer: Self-pay

## 2020-03-28 ENCOUNTER — Ambulatory Visit
Admission: RE | Admit: 2020-03-28 | Discharge: 2020-03-28 | Disposition: A | Discharge status: Home / Self Care | Payer: 59 | Source: Ambulatory Visit | Attending: Urology | Admitting: Urology

## 2020-03-28 ENCOUNTER — Encounter: Payer: Self-pay | Admitting: Urology

## 2020-03-28 ENCOUNTER — Ambulatory Visit
Admission: RE | Admit: 2020-03-28 | Discharge: 2020-03-28 | Disposition: A | Discharge status: Home / Self Care | Payer: 59 | Attending: Urology | Admitting: Urology

## 2020-03-28 ENCOUNTER — Ambulatory Visit (INDEPENDENT_AMBULATORY_CARE_PROVIDER_SITE_OTHER): Payer: 59 | Admitting: Urology

## 2020-03-28 VITALS — BP 106/73 | HR 91 | Ht 63.0 in | Wt 115.0 lb

## 2020-03-28 DIAGNOSIS — N201 Calculus of ureter: Secondary | ICD-10-CM

## 2020-03-28 NOTE — Progress Notes (Signed)
   03/28/2020 2:54 PM   Gabriel Williamson [birth year] [id number]  Referring provider: [facility] [address] [place],  [place] [address]  Chief Complaint  Patient presents with  . Nephrolithiasis    HPI: 40 y.o. male presents for follow-up of a right ureteral calculus.   Initially seen 03/12/2020 after a mid December ED visit for right renal colic secondary to a 2 mm right distal ureteral calculus  He was still having pain at the time of that visit; KUB showed a 2 mm calcification in the right true bony pelvis of uncertain significance  Since his visit his pain has resolved however he is not aware of passing a stone  Repeat KUB shows persistent calcification in the true bony pelvis   PMH: History reviewed. No pertinent past medical history.  Surgical History: History reviewed. No pertinent surgical history.  Home Medications:  Allergies as of 03/28/2020   No Known Allergies     Medication List       Accurate as of March 28, 2020  2:54 PM. If you have any questions, ask your nurse or doctor.        HYDROcodone-acetaminophen 5-325 MG tablet Commonly known as: NORCO/VICODIN Take 1-2 tablets by mouth every 6 (six) hours as needed for moderate pain or severe pain (No more than 6 tabs per day).   ibuprofen 600 MG tablet Commonly known as: ADVIL Take 1 tablet (600 mg total) by mouth every 8 (eight) hours as needed for moderate pain.   ondansetron 4 MG disintegrating tablet Commonly known as: Zofran ODT Take 1 tablet (4 mg total) by mouth every 8 (eight) hours as needed for nausea or vomiting.       Allergies: No Known Allergies  Family History: Family History  Problem Relation Age of Onset  . Diabetes Father     Social History:  reports that he has never smoked. He has never used smokeless tobacco. He reports previous alcohol use. He reports previous drug use.   Physical Exam: There were no vitals taken for this visit.  Constitutional:   Alert and oriented, No acute distress. HEENT: [place] AT, moist mucus membranes.  Trachea midline, no masses. Cardiovascular: No clubbing, cyanosis, or edema. Respiratory: Normal respiratory effort, no increased work of breathing. Neurologic: Grossly intact, no focal deficits, moving all 4 extremities. Psychiatric: Normal mood and affect.   Pertinent Imaging: KUB today reviewed and personally interpreted  Assessment & Plan:    1.  Right distal ureteral calculus  Based on previous stone size and location it has most likely passed  CT only showed mild hydronephrosis and to ensure the calculus is not present would recommend follow-up noncontrast CT  Will call with results   Riki Altes, MD  [facility] [address] [place], [place] [address] [phone]

## 2020-04-24 ENCOUNTER — Other Ambulatory Visit: Payer: Self-pay

## 2020-04-24 ENCOUNTER — Ambulatory Visit (LOCAL_COMMUNITY_HEALTH_CENTER): Payer: 59

## 2020-04-24 ENCOUNTER — Ambulatory Visit: Payer: Self-pay

## 2020-04-24 DIAGNOSIS — Z23 Encounter for immunization: Secondary | ICD-10-CM | POA: Diagnosis not present

## 2020-04-24 NOTE — Progress Notes (Signed)
Tdap given; tolerated well.Shaelee Forni, RN  

## 2021-08-30 IMAGING — CT CT RENAL STONE PROTOCOL
2 of 4 series · 16 of 46 positions shown, 18 images · non-contrast
Comparison: None.

CLINICAL DATA: Right flank pain.

EXAM:
CT ABDOMEN AND PELVIS WITHOUT CONTRAST
TECHNIQUE: Multidetector CT imaging of the abdomen and pelvis was performed
following the standard protocol without IV contrast.

[Series 2: stone full standard · axial · 0.64mm/px · z∈[-455,-65]mm · 13 of 86 slices shown, 15 images]
[im 4/86  soft-tissue]
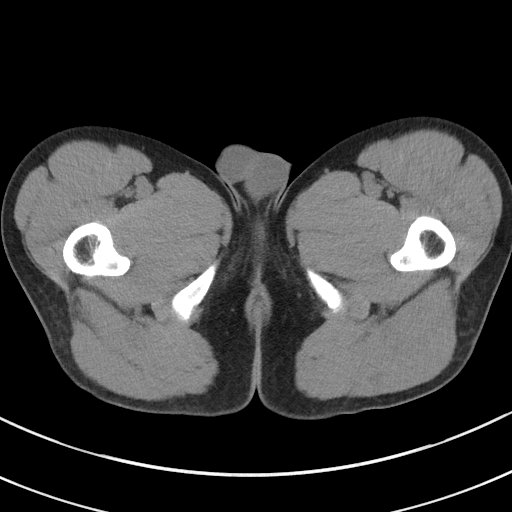
[im 4/86  bone]
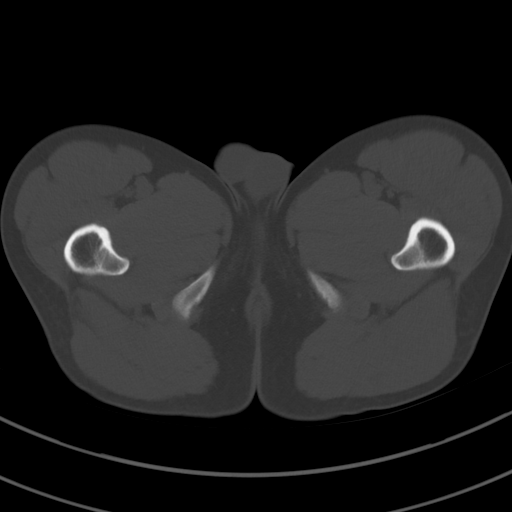
[im 11/86  soft-tissue]
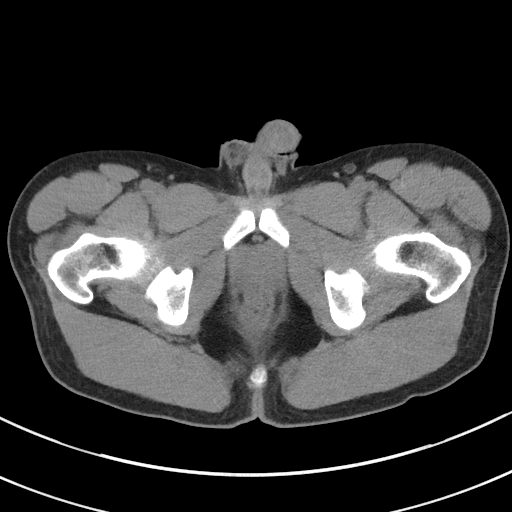
[im 18/86  soft-tissue]
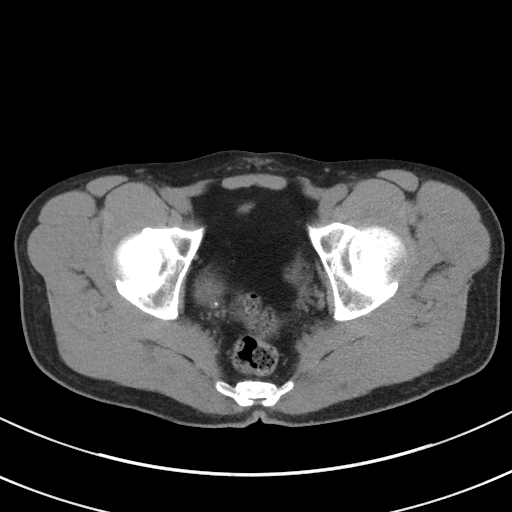
[im 24/86  soft-tissue]
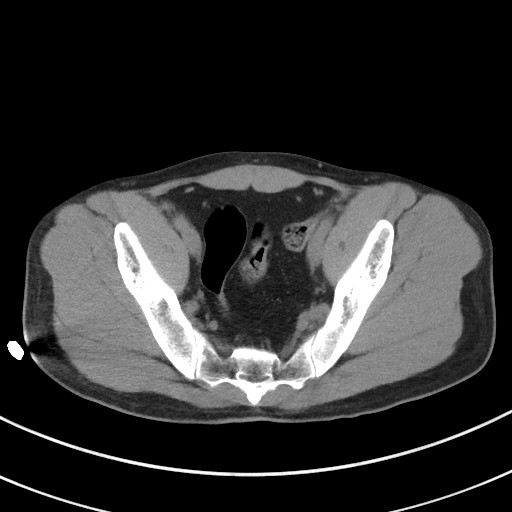
[im 31/86  soft-tissue]
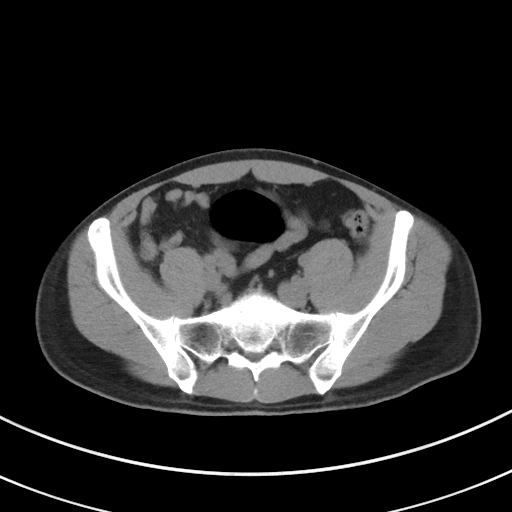
[im 38/86  soft-tissue]
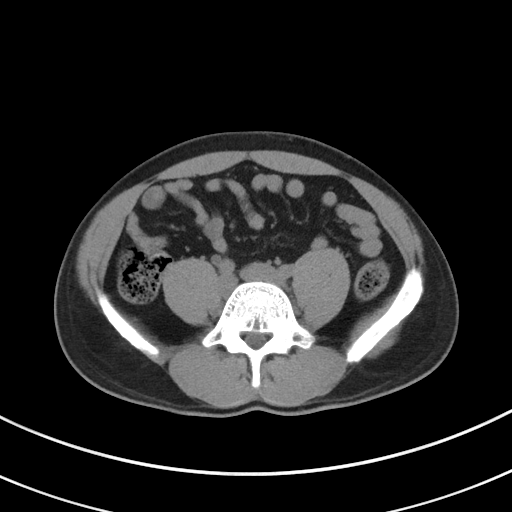
[im 45/86  soft-tissue]
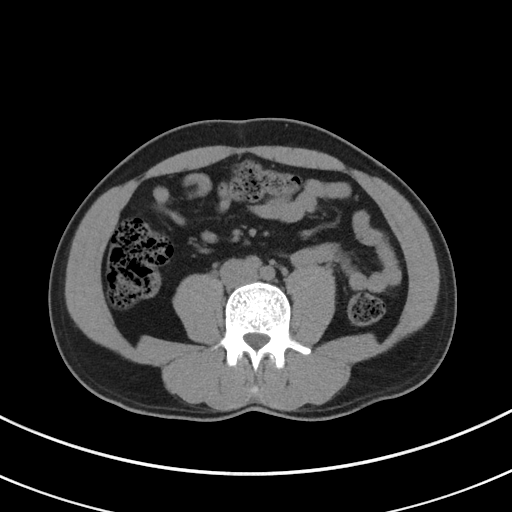
[im 48/86  soft-tissue]
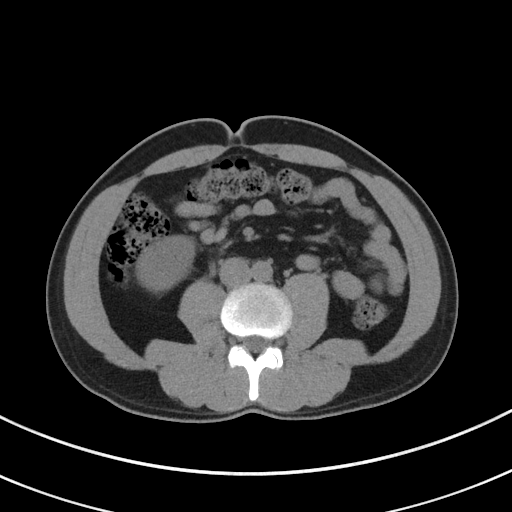
[im 55/86  soft-tissue]
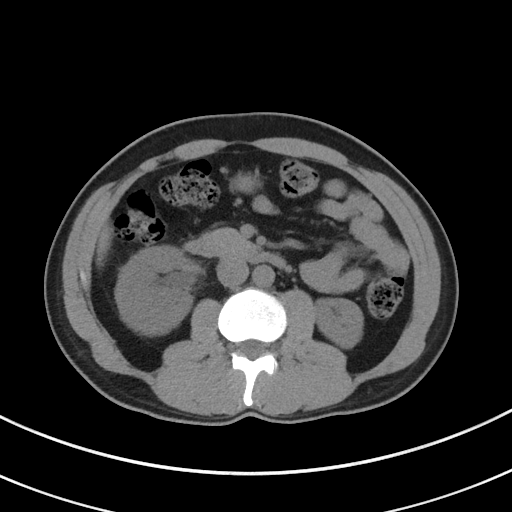
[im 55/86  bone]
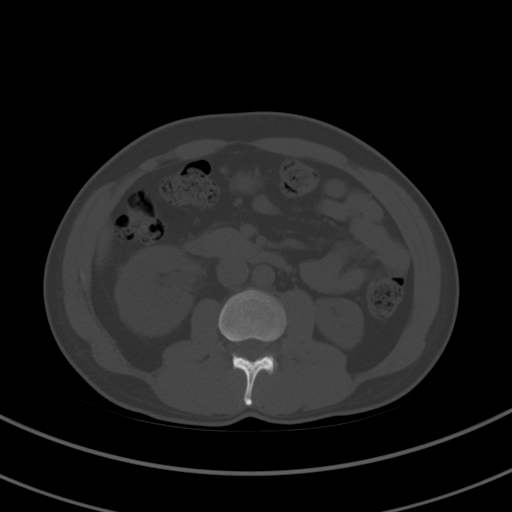
[im 62/86  soft-tissue]
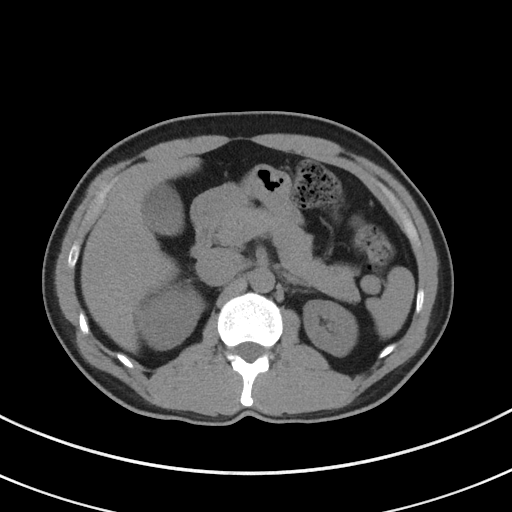
[im 69/86  soft-tissue]
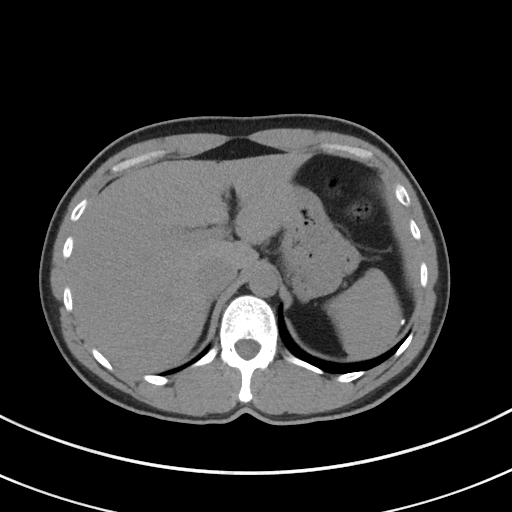
[im 75/86  soft-tissue]
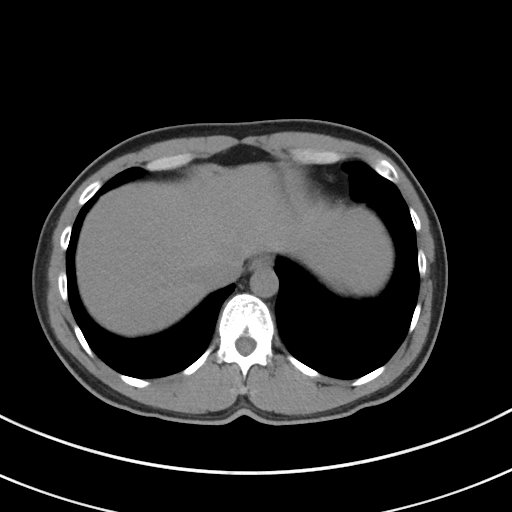
[im 82/86  soft-tissue]
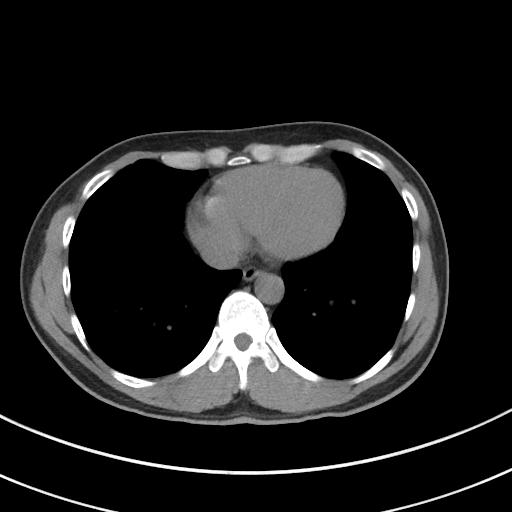

[Series 5: coronal · coronal · 0.60mm/px · 3 of 107 slices shown]
[im 36/107  soft-tissue]
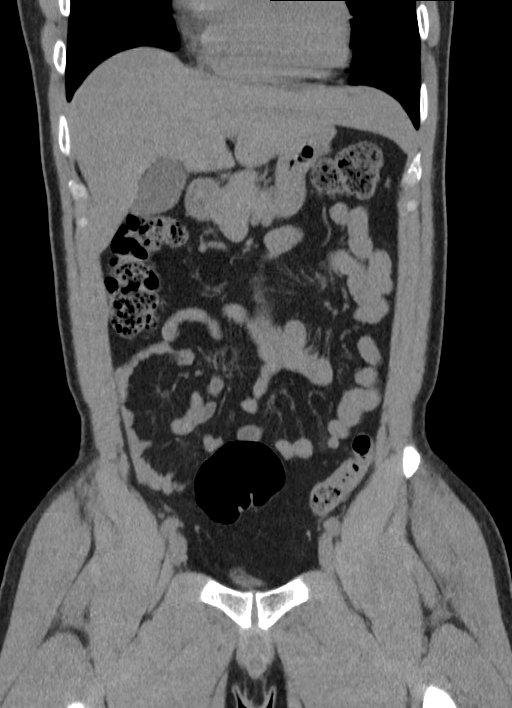
[im 48/107  soft-tissue]
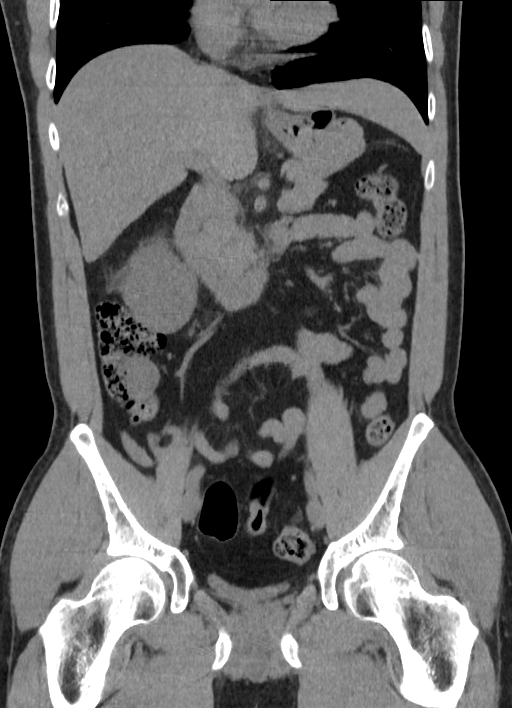
[im 59/107  soft-tissue]
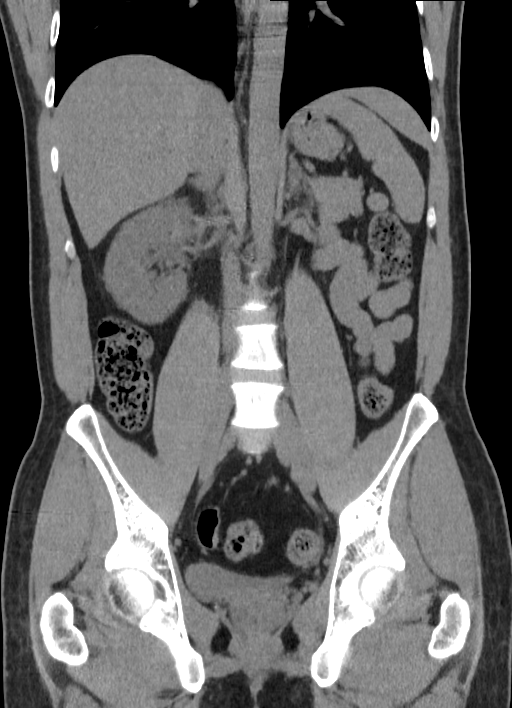

[16 of 46 positions shown; findings below may reference images not displayed]

FINDINGS: Lower chest: No acute abnormality.

Hepatobiliary: No focal liver abnormality is seen. No gallstones,
gallbladder wall thickening, or biliary dilatation.

Pancreas: Unremarkable. No pancreatic ductal dilatation or
surrounding inflammatory changes.

Spleen: Normal in size without focal abnormality.

Adrenals/Urinary Tract: Adrenal glands are normal. No renal stones
identified. There is mild right hydronephrosis and perinephric
stranding. There is right ureterectasis. These finding are due to a
2 mm stone in the distal right ureter. The left kidney is smaller
than the right with no hydronephrosis or perinephric stranding. The
left ureter and bladder are normal.

Stomach/Bowel: The stomach and small bowel are normal. The appendix
is normal in appearance. The colon is unremarkable.

Vascular/Lymphatic: No significant vascular findings are present. No
enlarged abdominal or pelvic lymph nodes.

Reproductive: Prostate is unremarkable.

Other: No free air or free fluid. There is a fat containing
periumbilical hernia which is small. There is a rounded partially
calcified structure along the lateral aspect of the cecum on axial
image 51 of doubtful significance.

Musculoskeletal: No acute or significant osseous findings.
IMPRESSION: 1. There is a 2 mm stone in the distal right ureter resulting in
mild hydronephrosis, perinephric stranding, and ureterectasis.
2. The left kidney measures 8.4 cm in craniocaudal dimension, mildly
atrophic.
3. No other acute abnormalities.

## 2022-09-08 ENCOUNTER — Other Ambulatory Visit: Payer: Self-pay | Admitting: Hematology & Oncology
